# Patient Record
Sex: Female | Born: 1965 | Race: White | Hispanic: No | Marital: Married
Health system: Southern US, Community
[De-identification: ages and names within clinical notes are randomized; demographics above are authoritative.]

## PROBLEM LIST (undated history)

## (undated) DIAGNOSIS — R000 Tachycardia, unspecified: Secondary | ICD-10-CM

## (undated) DIAGNOSIS — E7800 Pure hypercholesterolemia, unspecified: Secondary | ICD-10-CM

## (undated) DIAGNOSIS — J45909 Unspecified asthma, uncomplicated: Secondary | ICD-10-CM

## (undated) DIAGNOSIS — E119 Type 2 diabetes mellitus without complications: Secondary | ICD-10-CM

## (undated) DIAGNOSIS — I10 Essential (primary) hypertension: Secondary | ICD-10-CM

## (undated) HISTORY — PX: NECK SURGERY: SHX720

## (undated) HISTORY — PX: HAND SURGERY: SHX662

## (undated) HISTORY — PX: KNEE SURGERY: SHX244

---

## 2005-08-30 ENCOUNTER — Ambulatory Visit (HOSPITAL_COMMUNITY): Admission: RE | Admit: 2005-08-30 | Discharge: 2005-08-31 | Payer: Self-pay | Admitting: Neurosurgery

## 2018-01-10 ENCOUNTER — Encounter (HOSPITAL_COMMUNITY): Payer: Self-pay | Admitting: Emergency Medicine

## 2018-01-10 ENCOUNTER — Emergency Department (HOSPITAL_COMMUNITY)
Admission: EM | Admit: 2018-01-10 | Discharge: 2018-01-11 | Disposition: A | Discharge status: Home / Self Care | Payer: Self-pay | Attending: Emergency Medicine | Admitting: Emergency Medicine

## 2018-01-10 ENCOUNTER — Emergency Department (HOSPITAL_COMMUNITY): Payer: Self-pay

## 2018-01-10 ENCOUNTER — Other Ambulatory Visit: Payer: Self-pay

## 2018-01-10 DIAGNOSIS — J45909 Unspecified asthma, uncomplicated: Secondary | ICD-10-CM | POA: Insufficient documentation

## 2018-01-10 DIAGNOSIS — R0602 Shortness of breath: Secondary | ICD-10-CM | POA: Insufficient documentation

## 2018-01-10 DIAGNOSIS — E860 Dehydration: Secondary | ICD-10-CM | POA: Insufficient documentation

## 2018-01-10 DIAGNOSIS — E119 Type 2 diabetes mellitus without complications: Secondary | ICD-10-CM | POA: Insufficient documentation

## 2018-01-10 DIAGNOSIS — Z7722 Contact with and (suspected) exposure to environmental tobacco smoke (acute) (chronic): Secondary | ICD-10-CM | POA: Insufficient documentation

## 2018-01-10 DIAGNOSIS — R5383 Other fatigue: Secondary | ICD-10-CM | POA: Insufficient documentation

## 2018-01-10 DIAGNOSIS — N179 Acute kidney failure, unspecified: Secondary | ICD-10-CM | POA: Insufficient documentation

## 2018-01-10 DIAGNOSIS — R42 Dizziness and giddiness: Secondary | ICD-10-CM

## 2018-01-10 DIAGNOSIS — I10 Essential (primary) hypertension: Secondary | ICD-10-CM | POA: Insufficient documentation

## 2018-01-10 HISTORY — DX: Essential (primary) hypertension: I10

## 2018-01-10 HISTORY — DX: Pure hypercholesterolemia, unspecified: E78.00

## 2018-01-10 HISTORY — DX: Tachycardia, unspecified: R00.0

## 2018-01-10 HISTORY — DX: Type 2 diabetes mellitus without complications: E11.9

## 2018-01-10 HISTORY — DX: Unspecified asthma, uncomplicated: J45.909

## 2018-01-10 LAB — URINALYSIS, ROUTINE W REFLEX MICROSCOPIC
Bacteria, UA: NONE SEEN
Bilirubin Urine: NEGATIVE
Glucose, UA: NEGATIVE mg/dL
Hgb urine dipstick: NEGATIVE
KETONES UR: NEGATIVE mg/dL
Leukocytes, UA: NEGATIVE
Nitrite: NEGATIVE
PH: 5 (ref 5.0–8.0)
Protein, ur: NEGATIVE mg/dL
SPECIFIC GRAVITY, URINE: 1.009 (ref 1.005–1.030)

## 2018-01-10 LAB — CBC
HEMATOCRIT: 46.8 % — AB (ref 36.0–46.0)
HEMOGLOBIN: 15.2 g/dL — AB (ref 12.0–15.0)
MCH: 31.2 pg (ref 26.0–34.0)
MCHC: 32.5 g/dL (ref 30.0–36.0)
MCV: 96.1 fL (ref 78.0–100.0)
Platelets: 237 10*3/uL (ref 150–400)
RBC: 4.87 MIL/uL (ref 3.87–5.11)
RDW: 13.3 % (ref 11.5–15.5)
WBC: 12.3 10*3/uL — ABNORMAL HIGH (ref 4.0–10.5)

## 2018-01-10 LAB — POC URINE PREG, ED: Preg Test, Ur: NEGATIVE

## 2018-01-10 LAB — BASIC METABOLIC PANEL
ANION GAP: 11 (ref 5–15)
BUN: 26 mg/dL — ABNORMAL HIGH (ref 6–20)
CALCIUM: 9.6 mg/dL (ref 8.9–10.3)
CO2: 28 mmol/L (ref 22–32)
Chloride: 99 mmol/L (ref 98–111)
Creatinine, Ser: 1.67 mg/dL — ABNORMAL HIGH (ref 0.44–1.00)
GFR calc Af Amer: 40 mL/min — ABNORMAL LOW (ref >60)
GFR calc non Af Amer: 35 mL/min — ABNORMAL LOW (ref >60)
GLUCOSE: 92 mg/dL (ref 70–99)
POTASSIUM: 4.1 mmol/L (ref 3.5–5.1)
Sodium: 138 mmol/L (ref 135–145)

## 2018-01-10 LAB — I-STAT TROPONIN, ED
Troponin i, poc: 0.01 ng/mL (ref 0.00–0.08)
Troponin i, poc: 0.02 ng/mL (ref 0.00–0.08)

## 2018-01-10 LAB — I-STAT BETA HCG BLOOD, ED (MC, WL, AP ONLY): I-stat hCG, quantitative: <5 m[IU]/mL (ref <5)

## 2018-01-10 LAB — MAGNESIUM: MAGNESIUM: 1.9 mg/dL (ref 1.7–2.4)

## 2018-01-10 LAB — CK: CK TOTAL: 487 U/L — AB (ref 38–234)

## 2018-01-10 MED ORDER — SODIUM CHLORIDE 0.9 % IV BOLUS
1000.0000 mL | Freq: Once | INTRAVENOUS | Status: AC
  Administered 2018-01-10: 1000 mL via INTRAVENOUS

## 2018-01-10 MED ORDER — ALBUTEROL SULFATE (2.5 MG/3ML) 0.083% IN NEBU
5.0000 mg | INHALATION_SOLUTION | Freq: Once | RESPIRATORY_TRACT | Status: AC
  Administered 2018-01-10: 5 mg via RESPIRATORY_TRACT
  Filled 2018-01-10: qty 6

## 2018-01-10 NOTE — ED Provider Notes (Signed)
Patient placed in Quick Look pathway, seen and evaluated   Chief Complaint: shortness of breath, chest pain  HPI:   Anne Webb is a 52 y.o. female who presents to the ED with chest pain and shortness of breath and leg cramps Pt reports she started working in the yard around 1030 today. Pt developed cramps in her legs. Pt reports she developed CP across her chest into her ribcage and into her back, SHOB about 1 hour ago. Pt reports she has been drinking fluids today.    ROS: Resp: shortness of breath  Neuro: dizziness  C/V: chest pain  Physical Exam:  BP (!) 129/91 (BP Location: Right Arm)   Pulse 79   Temp 98.9 F (37.2 C) (Oral)   Resp (!) 22   Ht 5\' 3"  (1.6 m)   Wt 127.5 kg   SpO2 96%   BMI 49.78 kg/m    Gen: No distress  Neuro: Awake and Alert  Skin: Warm and dry  Resp: wheezing bilateral     Initiation of care has begun. The patient has been counseled on the process, plan, and necessity for staying for the completion/evaluation, and the remainder of the medical screening examination    Janne Napoleon, NP 01/10/18 1526    Vanetta Mulders, MD 01/13/18 [phone]

## 2018-01-10 NOTE — ED Triage Notes (Signed)
Pt reports she started working in the yard around 1030 today. Pt developed cramps in her legs. Pt reports she developed CP across her chest into her ribcage and into her back, SHOB about 1 hour ago. Pt reports she has been drinking fluids today. VSS.

## 2018-01-10 NOTE — ED Provider Notes (Signed)
[facility] EMERGENCY DEPARTMENT Provider Note   CSN: [id number] Arrival date & time: 01/10/18  1514     History   Chief Complaint Chief Complaint  Patient presents with  . Chest Pain  . muscle cramps    HPI Anne Webb is a 52 y.o. female.  The history is provided by the patient and medical records. No language interpreter was used.  Dizziness  Quality:  Lightheadedness Severity:  Moderate Onset quality:  Gradual Duration:  1 day Timing:  Constant Progression:  Unchanged Chronicity:  New Context: physical activity   Relieved by:  Lying down and fluids Worsened by:  Nothing Ineffective treatments:  None tried Associated symptoms: no blood in stool, no chest pain, no diarrhea, no headaches, no hearing loss, no nausea, no palpitations, no shortness of breath, no syncope, no vision changes, no vomiting and no weakness     Past Medical History:  Diagnosis Date  . Asthma   . Diabetes mellitus without complication (HCC)   . Hypercholesteremia   . Hypertension   . Tachycardia     There are no active problems to display for this patient.    OB History   None      Home Medications    Prior to Admission medications   Not on File    Family History No family history on file.  Social History Social History   Tobacco Use  . Smoking status: Passive Smoke Exposure - Never Smoker  . Smokeless tobacco: Never Used  Substance Use Topics  . Alcohol use: Yes    Comment: occasionally  . Drug use: Never     Allergies   Norvasc [amlodipine besylate]   Review of Systems Review of Systems  Constitutional: Positive for fatigue. Negative for chills, diaphoresis and fever.  HENT: Negative for congestion and hearing loss.   Eyes: Negative for visual disturbance.  Respiratory: Positive for wheezing. Negative for cough, chest tightness, shortness of breath and stridor.   Cardiovascular: Negative for chest pain, palpitations, leg swelling  and syncope.  Gastrointestinal: Negative for blood in stool, constipation, diarrhea, nausea and vomiting.  Genitourinary: Negative for flank pain.  Musculoskeletal: Positive for back pain. Negative for neck pain and neck stiffness.  Skin: Negative for rash and wound.  Neurological: Positive for light-headedness. Negative for syncope, speech difficulty, weakness and headaches.  Psychiatric/Behavioral: Negative for agitation.  All other systems reviewed and are negative.    Physical Exam Updated Vital Signs BP (!) 129/91 (BP Location: Right Arm)   Pulse 79   Temp 98.9 F (37.2 C) (Oral)   Resp (!) 22   Ht 5\' 3"  (1.6 m)   Wt 127.5 kg   SpO2 96%   BMI 49.78 kg/m   Physical Exam  Constitutional: She is oriented to person, place, and time. She appears well-developed and well-nourished. No distress.  HENT:  Head: Normocephalic and atraumatic.  Mouth/Throat: Oropharynx is clear and moist. No oropharyngeal exudate.  Eyes: Conjunctivae are normal.  Neck: Neck supple.  Cardiovascular: Normal rate and regular rhythm.  No murmur heard. Pulmonary/Chest: Effort normal. No stridor. No respiratory distress. She has wheezes. She has no rales. She exhibits no tenderness.  Abdominal: Soft. There is no tenderness.  Musculoskeletal: She exhibits tenderness. She exhibits no edema.  Neurological: She is alert and oriented to person, place, and time. She displays normal reflexes. No sensory deficit. She exhibits normal muscle tone.  Skin: Skin is warm and dry. Capillary refill takes less than 2 seconds. No  rash noted. She is not diaphoretic. No erythema.  Psychiatric: She has a normal mood and affect.  Nursing note and vitals reviewed.    ED Treatments / Results  Labs (all labs ordered are listed, but only abnormal results are displayed) Labs Reviewed  BASIC METABOLIC PANEL - Abnormal; Notable for the following components:      Result Value   BUN 26 (*)    Creatinine, Ser 1.67 (*)    GFR  calc non Af Amer 35 (*)    GFR calc Af Amer 40 (*)    All other components within normal limits  CBC - Abnormal; Notable for the following components:   WBC 12.3 (*)    Hemoglobin 15.2 (*)    HCT 46.8 (*)    All other components within normal limits  CK - Abnormal; Notable for the following components:   Total CK 487 (*)    All other components within normal limits  URINALYSIS, ROUTINE W REFLEX MICROSCOPIC - Abnormal; Notable for the following components:   Color, Urine STRAW (*)    All other components within normal limits  URINE CULTURE  MAGNESIUM  I-STAT TROPONIN, ED  I-STAT BETA HCG BLOOD, ED ([facility], [facility], [facility] ONLY)  I-STAT TROPONIN, ED  POC URINE PREG, ED    EKG EKG Interpretation  Date/Time:  Wednesday January 10 2018 15:20:43 EDT Ventricular Rate:  76 PR Interval:  190 QRS Duration: 90 QT Interval:  400 QTC Calculation: 450 R Axis:   75 Text Interpretation:  Normal sinus rhythm Normal ECG No prior ECG for comparison.  No STEMI Confirmed by Theda Belfast ([id number]) on 01/10/2018 9:12:32 PM   Radiology Dg Chest 2 View  Result Date: 01/10/2018 CLINICAL DATA:  Chest pain and shortness of breath as well as cramping in the arms. History of hypertension, diabetes, and hyperlipidemia. Nonsmoker. EXAM: CHEST - 2 VIEW COMPARISON:  PA and lateral chest x-ray of August 26, 2005 FINDINGS: The lungs are adequately inflated. There is no focal infiltrate. There is no pleural effusion. The heart and pulmonary vascularity are normal. The mediastinum is normal in width. The trachea is midline. The bony thorax exhibits no acute abnormality. IMPRESSION: There is no active cardiopulmonary disease. Electronically Signed   By: David  [place] M.D.   On: 01/10/2018 16:29    Procedures Procedures (including critical care time)  Medications Ordered in ED Medications  albuterol (PROVENTIL) (2.5 MG/3ML) 0.083% nebulizer solution 5 mg (5 mg Nebulization Given 01/10/18 1529)  sodium chloride 0.9 % bolus  1,000 mL (0 mLs Intravenous Stopped 01/10/18 2318)     Initial Impression / Assessment and Plan / ED Course  I have reviewed the triage vital signs and the nursing notes.  Pertinent labs & imaging results that were available during my care of the patient were reviewed by me and considered in my medical decision making (see chart for details).     Anne Webb is a 52 y.o. female with a past medical history significant for asthma, hypertension, diabetes, and hypercholesterolemia who presents with lightheadedness and feeling dehydrated with muscle spasms.  Patient reports that she works in the yard and today was feeling very lightheaded while working.  She reports cramping in both of her legs her back in her abdomen.  She denies chest pain on arrival.  She denies any nausea, vomiting, or syncope.  She thinks she has been drinking fluids but thinks she may be slightly dehydrated.  She denies any history of rhabdo or prior kidney injuries.  She denies any recent medication changes other than stopping ibuprofen and meloxicam which her pharmacist told her to do.  She denies other complaints.   On exam, patient is resting comfortably.  Lungs had very faint wheeze and chest nontender.  Patient had palpable muscle spasms in her legs and her back.  Abdomen nontender.  Patient resting comfortably.   Patient received a breathing treatment in triage with improved wheezing.  Clinical I suspect patient is dehydrated and may have rhabdo.  Patient laboratory testing showing slight elevation of CK into the 400s and evidence of acute kidney injury with a creatinine of 1.6.  No prior creatinine to compare.    EKG shows no STEMI.  Patient tolerated fluids and then was able to drink fluids.  Patient was offered further rehydration and monitoring however she would rather go home since she was feeling much better and less lightheaded after fluids.  Patient will follow-up with PCP in several days for re-check of  her creatinine as well as observe strict return precautions.  Patient had negative troponin x2 and otherwise normal magnesium, negative parents test, and reassuring urinalysis.  Other electro lites reassuring.    Given resolution of lightheadedness and well appearance, patient was discharged in good condition with understanding return precautions.   Final Clinical Impressions(s) / ED Diagnoses   Final diagnoses:  Dehydration  Lightheaded  AKI (acute kidney injury) [facility])    ED Discharge Orders    None      Clinical Impression: 1. Dehydration   2. Lightheaded   3. AKI (acute kidney injury) (HCC)     Disposition: Discharge  Condition: Good  I have discussed the results, Dx and Tx plan with the pt(& family if present). He/she/they expressed understanding and agree(s) with the plan. Discharge instructions discussed at great length. Strict return precautions discussed and pt &/or family have verbalized understanding of the instructions. No further questions at time of discharge.    There are no discharge medications for this patient.   Follow Up: Acey Lav DO [address] [place] [address] [phone]     [facility] EMERGENCY DEPARTMENT [address] [id number] mc [place] [address] [phone]       Wardell Pokorski, Canary Brim, MD 01/11/18 [phone]

## 2018-01-10 NOTE — Discharge Instructions (Addendum)
Your work-up today showed evidence of elevated kidney function and the elevated CK consistent with the dehydration and mild rhabdomyolysis.  Please push hydration for the next few days and please rest tomorrow.  Please follow-up with your primary doctor in several days for repeat evaluation of your kidney function.  If any symptoms change or worsen, please return to the nearest emergency department.  Please also stop taking the ibuprofen and Robaxin until you see your primary doctor for the kidney function reevaluation.

## 2018-01-10 NOTE — ED Notes (Signed)
EDP at bedside  

## 2018-01-12 LAB — URINE CULTURE

## 2020-02-19 IMAGING — CR DG CHEST 2V
2 series · 2 of 2 positions shown · non-contrast
Comparison: PA and lateral chest x-ray August 26, 2005

CLINICAL DATA: Chest pain and shortness of breath as well as
cramping in the arms. History of hypertension, diabetes, and
hyperlipidemia. Nonsmoker.

EXAM:
CHEST - 2 VIEW

[chest pa]
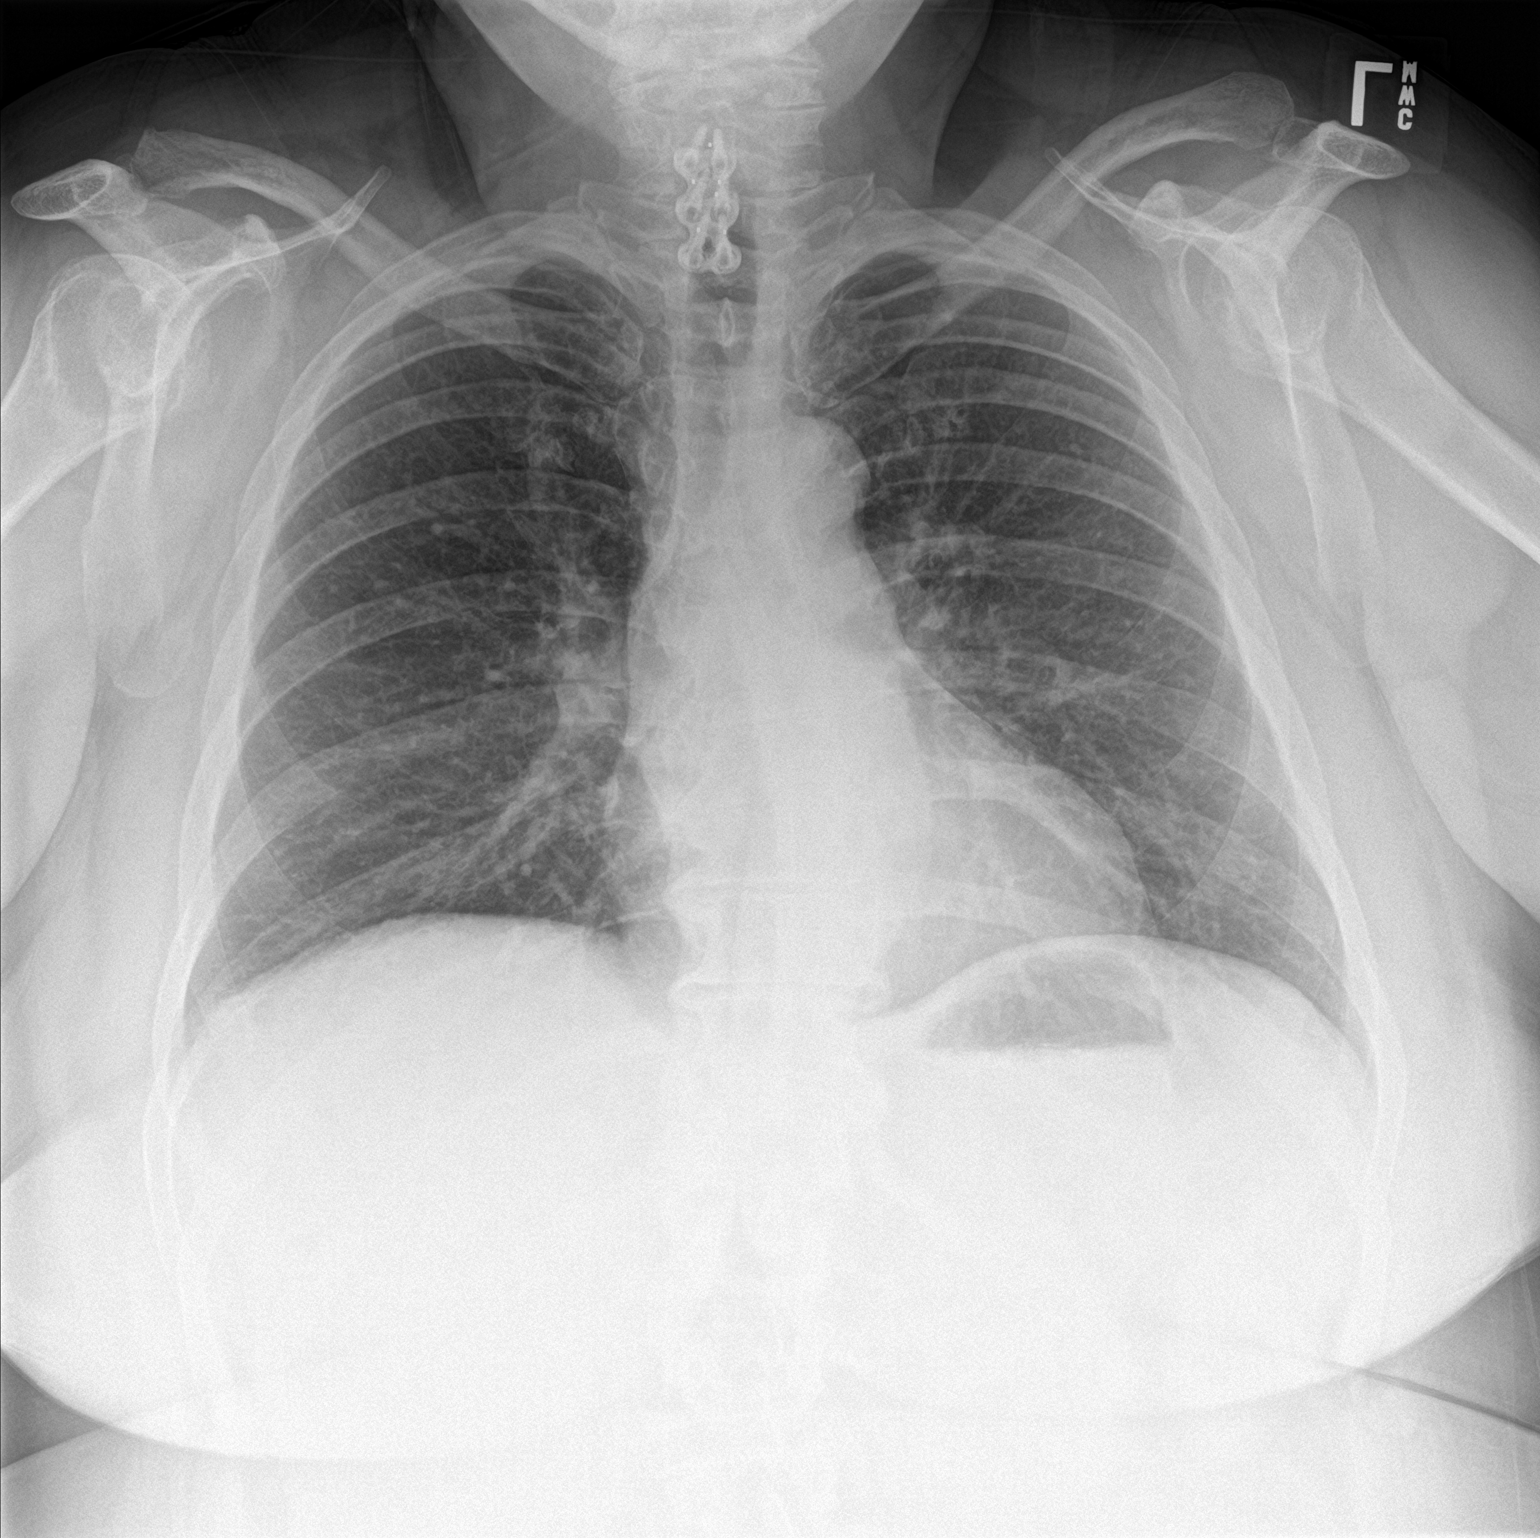

[chest lat]
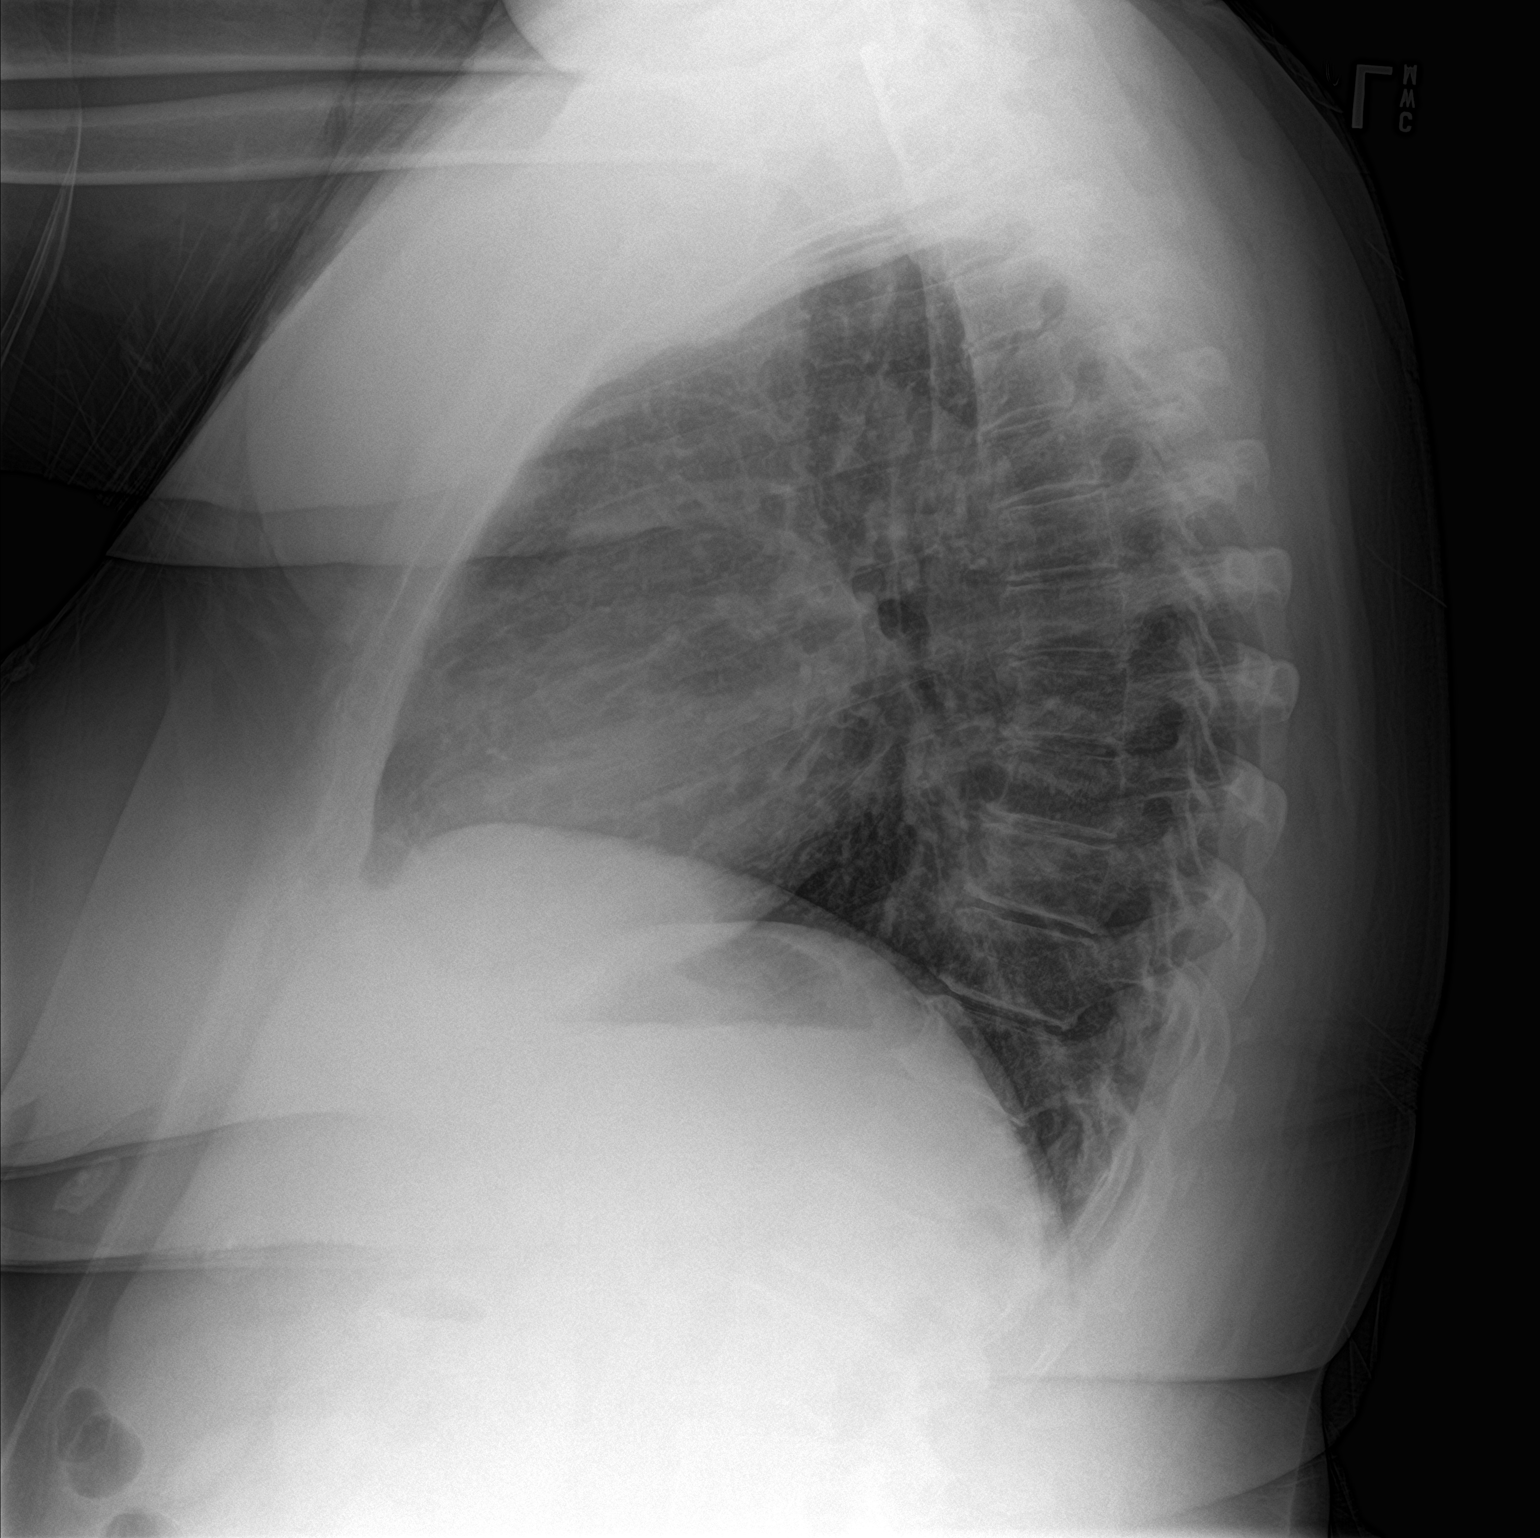

[2 of 2 positions shown; findings below may reference images not displayed]

FINDINGS: The lungs are adequately inflated. There is no focal infiltrate.
There is no pleural effusion. The heart and pulmonary vascularity
are normal. The mediastinum is normal in width. The trachea is
midline. The bony thorax exhibits no acute abnormality.
IMPRESSION: There is no active cardiopulmonary disease.
